# Patient Record
Sex: Male | Born: 1995 | Race: Black or African American | Hispanic: No | Marital: Single | Smoking: Never smoker
Health system: Southern US, Community
[De-identification: ages and names within clinical notes are randomized; demographics above are authoritative.]

## PROBLEM LIST (undated history)

## (undated) DIAGNOSIS — S83512D Sprain of anterior cruciate ligament of left knee, subsequent encounter: Secondary | ICD-10-CM

## (undated) DIAGNOSIS — S83207D Unspecified tear of unspecified meniscus, current injury, left knee, subsequent encounter: Secondary | ICD-10-CM

## (undated) HISTORY — PX: WISDOM TOOTH EXTRACTION: SHX21

---
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
## 2016-03-22 ENCOUNTER — Encounter (HOSPITAL_COMMUNITY): Payer: Self-pay | Admitting: *Deleted

## 2016-03-22 DIAGNOSIS — R51 Headache: Secondary | ICD-10-CM | POA: Insufficient documentation

## 2016-03-22 DIAGNOSIS — R930 Abnormal findings on diagnostic imaging of skull and head, not elsewhere classified: Secondary | ICD-10-CM | POA: Insufficient documentation

## 2016-03-22 NOTE — ED Notes (Signed)
Pt states he was bench presseing at the gym and had onset of headache in the back of his head. Initially he felt lightheaded, now subsided. Took Aleve prior to arrival without relief.

## 2016-03-23 ENCOUNTER — Emergency Department (HOSPITAL_COMMUNITY)
Admission: EM | Admit: 2016-03-23 | Discharge: 2016-03-23 | Disposition: A | Discharge status: Home / Self Care | Payer: Medicaid Other | Attending: Emergency Medicine | Admitting: Emergency Medicine

## 2016-03-23 ENCOUNTER — Emergency Department (HOSPITAL_COMMUNITY): Payer: Medicaid Other

## 2016-03-23 DIAGNOSIS — R51 Headache: Secondary | ICD-10-CM

## 2016-03-23 DIAGNOSIS — G08 Intracranial and intraspinal phlebitis and thrombophlebitis: Secondary | ICD-10-CM

## 2016-03-23 DIAGNOSIS — R519 Headache, unspecified: Secondary | ICD-10-CM

## 2016-03-23 LAB — I-STAT CHEM 8, ED
BUN: 14 mg/dL (ref 6–20)
CALCIUM ION: 1.15 mmol/L (ref 1.13–1.30)
CREATININE: 1.2 mg/dL (ref 0.61–1.24)
Chloride: 108 mmol/L (ref 101–111)
GLUCOSE: 83 mg/dL (ref 65–99)
HEMATOCRIT: 43 % (ref 39.0–52.0)
HEMOGLOBIN: 14.6 g/dL (ref 13.0–17.0)
Potassium: 3.8 mmol/L (ref 3.5–5.1)
Sodium: 142 mmol/L (ref 135–145)
TCO2: 25 mmol/L (ref 0–100)

## 2016-03-23 MED ORDER — IOPAMIDOL (ISOVUE-370) INJECTION 76%
INTRAVENOUS | Status: AC
Start: 2016-03-23 — End: 2016-03-23
  Administered 2016-03-23: 50 mL
  Filled 2016-03-23: qty 100

## 2016-03-23 MED ORDER — SODIUM CHLORIDE 0.9 % IV BOLUS (SEPSIS)
1000.0000 mL | Freq: Once | INTRAVENOUS | Status: AC
  Administered 2016-03-23: 1000 mL via INTRAVENOUS

## 2016-03-23 NOTE — ED Notes (Signed)
Patient transported to MRI 

## 2016-03-23 NOTE — Discharge Instructions (Signed)

## 2016-03-23 NOTE — ED Notes (Signed)
Returned from MRI 

## 2016-03-23 NOTE — ED Provider Notes (Signed)
MRI results were reviewed with the patient and his mother. No evidence of any abnormality. 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

## 2016-03-23 NOTE — ED Provider Notes (Signed)
CSN: [id number]     Arrival date & time 03/22/16  2222 History   First MD Initiated Contact with Patient 03/23/16 0142     Chief Complaint  Patient presents with  . Headache     (Consider location/radiation/quality/duration/timing/severity/associated sxs/prior Treatment) HPI Comments: 20 year old male with no significant past medical history presents to the emergency department for evaluation of headache. Patient states that he was bench pressing 225 pounds on an incline, which she has done before without difficulty, when he felt sudden onset of the pain in the back of his neck and occiput. He states that this sensation was followed by a few minutes of lightheadedness without syncope which progressed to a global headache. Patient reports taking NSAIDs at 1800 which temporarily improved his headache symptoms. He had some worsening of his headache at approximately 2100 and took additional medication for this. Patient has little complaints at this time. He denies any associated vision changes or vision loss, anterior neck pain, tinnitus or hearing loss, nausea, vomiting, photophobia, extremity numbness or weakness, chest pain, or shortness of breath. He has had no recent fevers.  Patient is a 20 y.o. male presenting with headaches. The history is provided by the patient. No language interpreter was used.  Headache   History reviewed. No pertinent past medical history. History reviewed. No pertinent past surgical history. No family history on file. Social History  Substance Use Topics  . Smoking status: Never Smoker   . Smokeless tobacco: None  . Alcohol Use: No    Review of Systems  Neurological: Positive for headaches.  Ten systems reviewed and are negative for acute change, except as noted in the HPI.    Allergies  Review of patient's allergies indicates not on file.  Home Medications   Prior to Admission medications   Not on File   BP 142/81 mmHg  Pulse 54  Temp(Src) 98.4 F  (36.9 C) (Oral)  Resp 18  Ht 5\' 10"  (1.778 m)  Wt 101.606 kg  BMI 32.14 kg/m2  SpO2 99%   Physical Exam  Constitutional: He is oriented to person, place, and time. He appears well-developed and well-nourished. No distress.  Nontoxic appearing and in no distress.  HENT:  Head: Normocephalic and atraumatic.  Mouth/Throat: Oropharynx is clear and moist. No oropharyngeal exudate.  Symmetric rise of the uvula with phonation.  Eyes: Conjunctivae and EOM are normal. Pupils are equal, round, and reactive to light. No scleral icterus.  Neck: Normal range of motion.  No nuchal rigidity or meningismus. No tenderness to the cervical paraspinal muscles bilaterally.  Cardiovascular: Normal rate, regular rhythm and intact distal pulses.   Pulmonary/Chest: Effort normal and breath sounds normal. No respiratory distress. He has no wheezes. He has no rales.  Lungs clear to auscultation bilaterally.  Musculoskeletal: Normal range of motion.  Neurological: He is alert and oriented to person, place, and time. No cranial nerve deficit. He exhibits normal muscle tone. Coordination normal.  GCS 15. Speech is goal oriented. No cranial nerve deficits appreciated; symmetric eyebrow raise, no facial drooping, tongue midline. Patient has equal grip strength bilaterally with 5/5 strength against resistance in all major muscle groups bilaterally. Sensation to light touch intact. Patient moves extremities without ataxia. He ambulates with steady gait.  Skin: Skin is warm and dry. No rash noted. He is not diaphoretic. No erythema. No pallor.  Psychiatric: He has a normal mood and affect. His behavior is normal.  Nursing note and vitals reviewed.   ED Course  Procedures (including  critical care time) Labs Review Labs Reviewed  I-STAT CHEM 8, ED    Imaging Review No results found.   I have personally reviewed and evaluated these images and lab results as part of my medical decision-making.   EKG  Interpretation   Date/Time:  Wednesday March 23 2016 02:18:08 EDT Ventricular Rate:  67 PR Interval:    QRS Duration: 105 QT Interval:  391 QTC Calculation: 413 R Axis:   80 Text Interpretation:  Sinus rhythm Borderline Q waves in lateral leads No  significant change since last tracing Confirmed by Erroll Lunani, Adeleke Ayokunle  [phone]([id number]) on 03/23/2016 2:30:09 AM      6:09 AM Notified by Particia Latherourtney Bloomer, MD, of radiology about concern for superior sagittal sinus thrombosis on CTA of the head and neck. There has been difficulty obtaining all images causing delay in official read; however, Dr. Karie KirksBloomer verbally recommends further evaluation with MRV. She states that CTA findings may represent a normal variant, but unable to rule out superior sagittal sinus thrombosis without further imaging.  MDM   Final diagnoses:  Acute nonintractable headache, unspecified headache type    20 year old male presents to the emergency department for acute onset of neck pain while lifting weights. This was followed by lightheadedness as well as a global headache which improved temporarily with NSAIDs. Patient with a nonfocal neurologic exam today. He was markedly tachycardic when initially triaged. Heart rate has normalized and EKG shows no PR interval changes to suggest an underlying tachyarrhythmia. Patient denies chest pain and shortness of breath.  Case discussed with my attending, Dr. Mora Bellmanni, who recommended CTA to evaluate for dissection. 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 Further evaluation with MRV recommended. Dr. Mora Bellmanni to follow and discuss need for further work up with the patient and mother, at bedside. If MRV negative, anticipate discharge home.    Antony MaduraKelly Auria Mckinlay, PA-C 03/23/16 [phone]  Tomasita CrumbleAdeleke Oni, MD 03/23/16 [phone]

## 2016-03-23 NOTE — ED Notes (Signed)
Pt back from CT

## 2016-03-23 NOTE — ED Notes (Signed)
Lifting weights and had sudden onset headache and neck pain.

## 2016-03-23 NOTE — ED Notes (Signed)
Family updated on plan of care CT will take pt next.

## 2016-03-23 NOTE — ED Notes (Signed)
Patient transported to CT 

## 2016-06-30 IMAGING — CT CT ANGIO NECK
1 of 17 series · 2 of 33 positions shown · IV contrast (Iohexol (Omnipaque 350))
Comparison: None.

CLINICAL DATA: Acute onset head and neck pain while lifting
weights.

EXAM:
CT ANGIOGRAPHY HEAD AND NECK
TECHNIQUE: Multidetector CT imaging of the head and neck was performed using
the standard protocol during bolus administration of intravenous
contrast. Multiplanar CT image reconstructions and MIPs were
obtained to evaluate the vascular anatomy. Carotid stenosis
measurements (when applicable) are obtained utilizing NASCET
criteria, using the distal internal carotid diameter as the
denominator.
CONTRAST:  50 cc IV Isovue 370

[Series 5011: new ax mip · axial · 0.55mm/px · z∈[+718,+830]mm · 2 of 338 slices shown]
[im 113/338  soft-tissue]
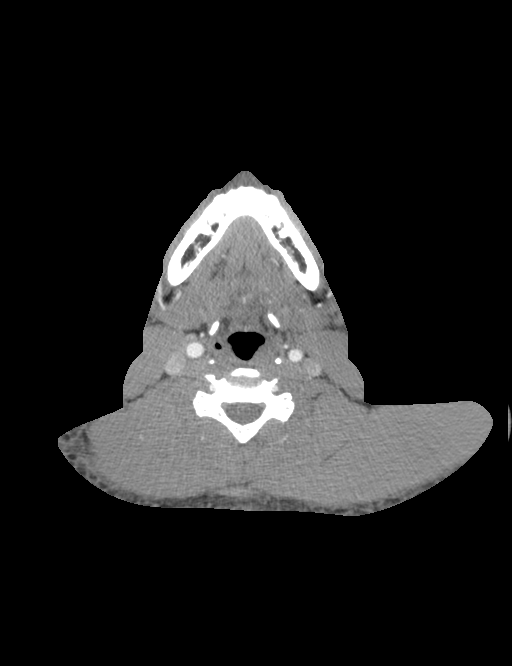
[im 225/338  bone]
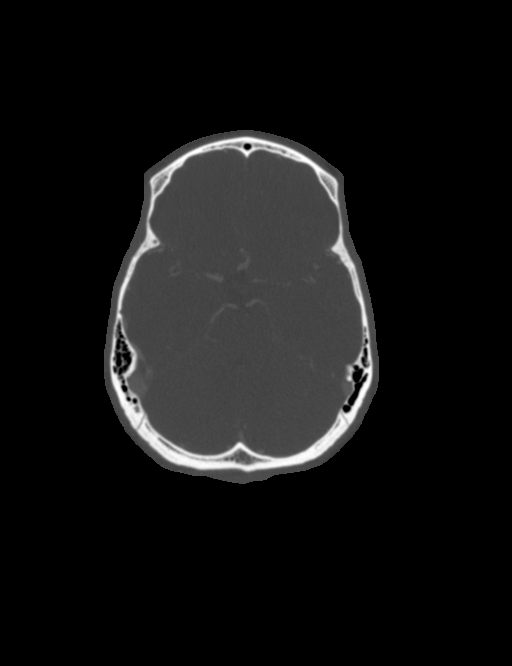

[2 of 33 positions shown; findings below may reference images not displayed]

FINDINGS: CT HEAD

INTRACRANIAL CONTENTS: The ventricles and sulci are normal. No
intraparenchymal hemorrhage, mass effect nor midline shift. No acute
large vascular territory infarcts. No abnormal extra-axial fluid
collections. Basal cisterns are patent.

ORBITS: The included ocular globes and orbital contents are normal.

SINUSES: The mastoid aircells and included paranasal sinuses are
well-aerated.

SKULL/SOFT TISSUES: No skull fracture. No significant soft tissue
swelling.

CTA NECK

AORTIC ARCH: Normal appearance of the thoracic arch, normal branch
pattern. The origins of the innominate, left Common carotid artery
and subclavian artery are widely patent.

RIGHT CAROTID SYSTEM: Common carotid artery is widely patent,
coursing in a straight line fashion. Normal appearance of the
carotid bifurcation without hemodynamically significant stenosis by
NASCET criteria. Normal appearance of the included internal carotid
artery.

LEFT CAROTID SYSTEM: Common carotid artery is widely patent,
coursing in a straight line fashion. Normal appearance of the
carotid bifurcation without hemodynamically significant stenosis by
NASCET criteria. Normal appearance of the included internal carotid
artery.

VERTEBRAL ARTERIES:Left vertebral artery is dominant. Normal
appearance of the vertebral arteries, which appear widely patent.

SKELETON: No acute osseous process though bone windows have not been
submitted.

OTHER NECK: Soft tissues of the neck are non-acute though, not
tailored for evaluation. 6 mm RIGHT thyroid nodule is below size
followup recommendation.

CTA HEAD

ANTERIOR CIRCULATION: Normal appearance of the cervical internal
carotid arteries, petrous, cavernous and supra clinoid internal
carotid arteries. Widely patent anterior communicating artery.
Normal appearance of the anterior and middle cerebral arteries.

No large vessel occlusion, hemodynamically significant stenosis,
dissection, luminal irregularity, contrast extravasation or
aneurysm.

POSTERIOR CIRCULATION: Codominant vertebral arteries with normal
appearance of the vertebral arteries, vertebrobasilar junction and
basilar artery, as well as main branch vessels. Robust RIGHT
posterior communicating artery present. Normal appearance of the
posterior cerebral arteries.

No large vessel occlusion, hemodynamically significant stenosis,
dissection, luminal irregularity, contrast extravasation or
aneurysm.

VENOUS SINUSES: No significant contrast opacification the anterior
superior sagittal sinus.

ANATOMIC VARIANTS: None.

DELAYED PHASE: No abnormal intracranial enhancement.
IMPRESSION: Normal CT HEAD.

Poor contrast opacification anterior superior sagittal sinus
concerning for thrombosis, versus developmental variant. Recommend
MRV head.

Otherwise negative CTA HEAD and neck.

Acute findings discussed with and reconfirmed by ARATI PAVEL on
03/23/2016 at [DATE].

## 2018-09-17 ENCOUNTER — Encounter (HOSPITAL_BASED_OUTPATIENT_CLINIC_OR_DEPARTMENT_OTHER): Payer: Self-pay | Admitting: Physician Assistant

## 2018-09-17 DIAGNOSIS — S83512D Sprain of anterior cruciate ligament of left knee, subsequent encounter: Secondary | ICD-10-CM

## 2018-09-17 DIAGNOSIS — S83207D Unspecified tear of unspecified meniscus, current injury, left knee, subsequent encounter: Secondary | ICD-10-CM

## 2018-09-17 HISTORY — DX: Unspecified tear of unspecified meniscus, current injury, left knee, subsequent encounter: S83.512D

## 2018-09-17 NOTE — H&P (Addendum)
Jeffrey Jeffrey Johnston is an 22 y.o. male.   Chief Complaint: left knee ACL tear with meniscus tears HPI: [name] is a 22 year-old [employer] seen for follow up from his left knee ACL tear with medial and lateral meniscal tears and LCL sprain.  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  He is almost one month status post injury.    Past Medical History:  Diagnosis Date  . Known health problems: none   . Tears of meniscus and ACL of left knee, subsequent encounter 09/17/2018    Past Surgical History:  Procedure Laterality Date  . none      History reviewed. No pertinent family history. Social History:  reports that he has never smoked. He does not have any smokeless tobacco history on file. He reports that he does not drink alcohol or use drugs.  Allergies: No Known Allergies  No medications prior to admission.    No results found for this or any previous visit (from the past 48 hour(s)). No results found.  Review of Systems  Constitutional: Negative.   HENT: Negative.   Eyes: Negative.   Respiratory: Negative.   Cardiovascular: Negative.   Gastrointestinal: Negative.   Genitourinary: Negative.   Musculoskeletal: Positive for joint pain.  Skin: Negative.   Neurological: Negative.   Endo/Heme/Allergies: Negative.   Psychiatric/Behavioral: Negative.     Height 5\' 11"  (1.803 m), weight 95.3 kg. Physical Exam  Constitutional: He is oriented to person, place, and time. He appears well-developed and well-nourished.  HENT:  Head: Normocephalic and atraumatic.  Mouth/Throat: Oropharynx is clear and moist.  Eyes: Pupils are equal, round, and reactive to light. Conjunctivae and EOM are normal.  Neck: Neck supple.  Cardiovascular: Normal rate.  Respiratory: Effort normal.  GI: Soft.  Genitourinary:    Genitourinary Comments: Not pertinent to current symptomatology therefore not examined.   Musculoskeletal:   Comments: Alert and oriented.  Examination of his left knee reveals 1-2+ effusion.  3+ Lachman.  Range of motion 0-115 degrees.  Knee is otherwise stable.  Right knee has full range of motion without pain, swelling or instability  Neurological: He is alert and oriented to person, place, and time.  Skin: Skin is warm and dry.  Psychiatric: He has a normal mood and affect. His behavior is normal.     Assessment Principal Problem:   Tears of meniscus and ACL of left knee, subsequent encounter   Plan Left knee hamstring autograft anterior cruciate ligament reconstruction with partial medial and lateral meniscectomies versus repairs.  The risks, benefits, and possible complications of the procedure were discussed in detail with the patient.  The patient is without question.  Haleemah Buckalew Tama HeadingsJ Kieara Schwark, PA-C 09/17/2018, 8:37 PM

## 2018-09-20 ENCOUNTER — Other Ambulatory Visit: Payer: Self-pay

## 2018-09-20 ENCOUNTER — Encounter (HOSPITAL_BASED_OUTPATIENT_CLINIC_OR_DEPARTMENT_OTHER): Payer: Self-pay | Admitting: *Deleted

## 2018-09-27 NOTE — Anesthesia Preprocedure Evaluation (Addendum)
Anesthesia Evaluation  Patient identified by MRN, date of birth, ID band Patient awake    Reviewed: Allergy & Precautions, NPO status , Patient's Chart, lab work & pertinent test results  History of Anesthesia Complications Negative for: history of anesthetic complications  Airway Mallampati: I  TM Distance: >3 FB Neck ROM: Full    Dental  (+) Dental Advisory Given, Teeth Intact   Pulmonary neg pulmonary ROS,    breath sounds clear to auscultation       Cardiovascular negative cardio ROS   Rhythm:Regular Rate:Normal     Neuro/Psych negative neurological ROS     GI/Hepatic negative GI ROS, Neg liver ROS,   Endo/Other  negative endocrine ROS  Renal/GU negative Renal ROS     Musculoskeletal   Abdominal   Peds  Hematology negative hematology ROS (+)   Anesthesia Other Findings   Reproductive/Obstetrics                             Anesthesia Physical Anesthesia Plan  ASA: I  Anesthesia Plan: General   Post-op Pain Management: GA combined w/ Regional for post-op pain   Induction: Intravenous  PONV Risk Score and Plan: 3 and Ondansetron, Dexamethasone and Scopolamine patch - Pre-op  Airway Management Planned: LMA  Additional Equipment:   Intra-op Plan:   Post-operative Plan:   Informed Consent: I have reviewed the patients History and Physical, chart, labs and discussed the procedure including the risks, benefits and alternatives for the proposed anesthesia with the patient or authorized representative who has indicated his/her understanding and acceptance.   Dental advisory given  Plan Discussed with: CRNA and Surgeon  Anesthesia Plan Comments: (Plan routine monitors, GA- LMA OK, adductor canal block for post op analgesia)       Anesthesia Quick Evaluation

## 2018-09-28 ENCOUNTER — Ambulatory Visit (HOSPITAL_BASED_OUTPATIENT_CLINIC_OR_DEPARTMENT_OTHER): Payer: 59 | Admitting: Anesthesiology

## 2018-09-28 ENCOUNTER — Other Ambulatory Visit: Payer: Self-pay

## 2018-09-28 ENCOUNTER — Ambulatory Visit (HOSPITAL_BASED_OUTPATIENT_CLINIC_OR_DEPARTMENT_OTHER)
Admission: RE | Admit: 2018-09-28 | Discharge: 2018-09-28 | Disposition: A | Discharge status: Home / Self Care | Payer: 59 | Attending: Orthopedic Surgery | Admitting: Orthopedic Surgery

## 2018-09-28 ENCOUNTER — Encounter (HOSPITAL_BASED_OUTPATIENT_CLINIC_OR_DEPARTMENT_OTHER): Payer: Self-pay | Admitting: *Deleted

## 2018-09-28 ENCOUNTER — Encounter (HOSPITAL_BASED_OUTPATIENT_CLINIC_OR_DEPARTMENT_OTHER)
Admission: RE | Disposition: A | Discharge status: Home / Self Care | Payer: Self-pay | Source: Home / Self Care | Attending: Orthopedic Surgery

## 2018-09-28 DIAGNOSIS — S83242A Other tear of medial meniscus, current injury, left knee, initial encounter: Secondary | ICD-10-CM | POA: Diagnosis present

## 2018-09-28 DIAGNOSIS — Y9361 Activity, american tackle football: Secondary | ICD-10-CM | POA: Diagnosis not present

## 2018-09-28 DIAGNOSIS — S83222A Peripheral tear of medial meniscus, current injury, left knee, initial encounter: Secondary | ICD-10-CM | POA: Diagnosis not present

## 2018-09-28 DIAGNOSIS — S83512A Sprain of anterior cruciate ligament of left knee, initial encounter: Secondary | ICD-10-CM | POA: Insufficient documentation

## 2018-09-28 DIAGNOSIS — X501XXA Overexertion from prolonged static or awkward postures, initial encounter: Secondary | ICD-10-CM | POA: Diagnosis not present

## 2018-09-28 DIAGNOSIS — S83207D Unspecified tear of unspecified meniscus, current injury, left knee, subsequent encounter: Secondary | ICD-10-CM

## 2018-09-28 DIAGNOSIS — S83282A Other tear of lateral meniscus, current injury, left knee, initial encounter: Secondary | ICD-10-CM | POA: Insufficient documentation

## 2018-09-28 DIAGNOSIS — S83512D Sprain of anterior cruciate ligament of left knee, subsequent encounter: Secondary | ICD-10-CM

## 2018-09-28 HISTORY — DX: Sprain of anterior cruciate ligament of left knee, subsequent encounter: S83.512D

## 2018-09-28 HISTORY — PX: KNEE ARTHROSCOPY WITH ANTERIOR CRUCIATE LIGAMENT (ACL) REPAIR WITH HAMSTRING GRAFT: SHX5645

## 2018-09-28 HISTORY — DX: Unspecified tear of unspecified meniscus, current injury, left knee, subsequent encounter: S83.207D

## 2018-09-28 SURGERY — KNEE ARTHROSCOPY WITH ANTERIOR CRUCIATE LIGAMENT (ACL) REPAIR WITH HAMSTRING GRAFT
Anesthesia: General | Site: Knee | Laterality: Left

## 2018-09-28 MED ORDER — CHLORHEXIDINE GLUCONATE 4 % EX LIQD: 60.0000 mL | Freq: Once | CUTANEOUS | Status: DC

## 2018-09-28 MED ORDER — MIDAZOLAM HCL 2 MG/2ML IJ SOLN
INTRAMUSCULAR | Status: AC
  Filled 2018-09-28: qty 2

## 2018-09-28 MED ORDER — LACTATED RINGERS IV SOLN
INTRAVENOUS | Status: DC
  Administered 2018-09-28 (×2): via INTRAVENOUS

## 2018-09-28 MED ORDER — SCOPOLAMINE 1 MG/3DAYS TD PT72
MEDICATED_PATCH | TRANSDERMAL | Status: AC
  Filled 2018-09-28: qty 1

## 2018-09-28 MED ORDER — HYDROMORPHONE HCL 1 MG/ML IJ SOLN
INTRAMUSCULAR | Status: AC
  Filled 2018-09-28: qty 0.5

## 2018-09-28 MED ORDER — SCOPOLAMINE 1 MG/3DAYS TD PT72: 1.0000 | MEDICATED_PATCH | Freq: Once | TRANSDERMAL | Status: DC | PRN

## 2018-09-28 MED ORDER — PROMETHAZINE HCL 25 MG/ML IJ SOLN: 6.2500 mg | INTRAMUSCULAR | Status: DC | PRN

## 2018-09-28 MED ORDER — LIDOCAINE HCL (CARDIAC) PF 100 MG/5ML IV SOSY
PREFILLED_SYRINGE | INTRAVENOUS | Status: DC | PRN
  Administered 2018-09-28: 40 mg via INTRAVENOUS

## 2018-09-28 MED ORDER — ONDANSETRON HCL 4 MG/2ML IJ SOLN
INTRAMUSCULAR | Status: DC | PRN
  Administered 2018-09-28: 4 mg via INTRAVENOUS

## 2018-09-28 MED ORDER — PROPOFOL 10 MG/ML IV BOLUS
INTRAVENOUS | Status: AC
  Filled 2018-09-28: qty 20

## 2018-09-28 MED ORDER — PROPOFOL 10 MG/ML IV BOLUS
INTRAVENOUS | Status: DC | PRN
  Administered 2018-09-28: 300 mg via INTRAVENOUS

## 2018-09-28 MED ORDER — PROPOFOL 500 MG/50ML IV EMUL
INTRAVENOUS | Status: AC
  Filled 2018-09-28: qty 50

## 2018-09-28 MED ORDER — MEPERIDINE HCL 25 MG/ML IJ SOLN: 6.2500 mg | INTRAMUSCULAR | Status: DC | PRN

## 2018-09-28 MED ORDER — PROMETHAZINE HCL 12.5 MG PO TABS: 12.5000 mg | ORAL_TABLET | Freq: Four times a day (QID) | ORAL | 0 refills | Status: AC | PRN

## 2018-09-28 MED ORDER — LACTATED RINGERS IV SOLN: INTRAVENOUS | Status: DC

## 2018-09-28 MED ORDER — FENTANYL CITRATE (PF) 100 MCG/2ML IJ SOLN
INTRAMUSCULAR | Status: AC
  Filled 2018-09-28: qty 2

## 2018-09-28 MED ORDER — ROPIVACAINE HCL 7.5 MG/ML IJ SOLN
INTRAMUSCULAR | Status: DC | PRN
  Administered 2018-09-28: 20 mL via PERINEURAL

## 2018-09-28 MED ORDER — HYDROMORPHONE HCL 1 MG/ML IJ SOLN
0.2500 mg | INTRAMUSCULAR | Status: DC | PRN
  Administered 2018-09-28 (×3): 0.5 mg via INTRAVENOUS

## 2018-09-28 MED ORDER — MIDAZOLAM HCL 2 MG/2ML IJ SOLN: 0.5000 mg | Freq: Once | INTRAMUSCULAR | Status: DC | PRN

## 2018-09-28 MED ORDER — DEXAMETHASONE SODIUM PHOSPHATE 4 MG/ML IJ SOLN
INTRAMUSCULAR | Status: DC | PRN
  Administered 2018-09-28: 10 mg via INTRAVENOUS

## 2018-09-28 MED ORDER — OXYCODONE HCL 5 MG PO TABS
5.0000 mg | ORAL_TABLET | Freq: Once | ORAL | Status: AC
  Administered 2018-09-28: 5 mg via ORAL

## 2018-09-28 MED ORDER — OXYCODONE HCL 5 MG PO TABS: 5.0000 mg | ORAL_TABLET | ORAL | 0 refills | Status: AC | PRN

## 2018-09-28 MED ORDER — BUPIVACAINE-EPINEPHRINE 0.25% -1:200000 IJ SOLN
INTRAMUSCULAR | Status: DC | PRN
  Administered 2018-09-28: 20 mL

## 2018-09-28 MED ORDER — DEXAMETHASONE SODIUM PHOSPHATE 10 MG/ML IJ SOLN
INTRAMUSCULAR | Status: AC
  Filled 2018-09-28: qty 1

## 2018-09-28 MED ORDER — POVIDONE-IODINE 7.5 % EX SOLN: Freq: Once | CUTANEOUS | Status: DC

## 2018-09-28 MED ORDER — BUPIVACAINE-EPINEPHRINE (PF) 0.25% -1:200000 IJ SOLN
INTRAMUSCULAR | Status: AC
  Filled 2018-09-28: qty 30

## 2018-09-28 MED ORDER — LIDOCAINE 2% (20 MG/ML) 5 ML SYRINGE
INTRAMUSCULAR | Status: AC
  Filled 2018-09-28: qty 5

## 2018-09-28 MED ORDER — FENTANYL CITRATE (PF) 100 MCG/2ML IJ SOLN
50.0000 ug | INTRAMUSCULAR | Status: AC | PRN
  Administered 2018-09-28: 25 ug via INTRAVENOUS
  Administered 2018-09-28 (×5): 50 ug via INTRAVENOUS
  Administered 2018-09-28: 25 ug via INTRAVENOUS
  Administered 2018-09-28: 50 ug via INTRAVENOUS

## 2018-09-28 MED ORDER — SCOPOLAMINE 1 MG/3DAYS TD PT72
1.0000 | MEDICATED_PATCH | Freq: Once | TRANSDERMAL | Status: DC
  Administered 2018-09-28: 1.5 mg via TRANSDERMAL

## 2018-09-28 MED ORDER — CEFAZOLIN SODIUM-DEXTROSE 2-4 GM/100ML-% IV SOLN
2.0000 g | INTRAVENOUS | Status: AC
  Administered 2018-09-28: 2 g via INTRAVENOUS

## 2018-09-28 MED ORDER — CEFAZOLIN SODIUM-DEXTROSE 2-4 GM/100ML-% IV SOLN
INTRAVENOUS | Status: AC
  Filled 2018-09-28: qty 100

## 2018-09-28 MED ORDER — OXYCODONE HCL 5 MG PO TABS
ORAL_TABLET | ORAL | Status: AC
  Filled 2018-09-28: qty 1

## 2018-09-28 MED ORDER — ONDANSETRON HCL 4 MG/2ML IJ SOLN
INTRAMUSCULAR | Status: AC
  Filled 2018-09-28: qty 2

## 2018-09-28 MED ORDER — MIDAZOLAM HCL 2 MG/2ML IJ SOLN
1.0000 mg | INTRAMUSCULAR | Status: DC | PRN
  Administered 2018-09-28: 2 mg via INTRAVENOUS

## 2018-09-28 SURGICAL SUPPLY — 91 items
ALLOGRAFT GRFTLNK IMPLANT SYST (Anchor) IMPLANT
ANCH SUT PUSHLCK 19.5X3.5 STRL (Anchor) ×1 IMPLANT
ANCHOR BUTTON TIGHTROPE RN 14 (Anchor) ×2 IMPLANT
ANCHOR PUSHLOCK PEEK 3.5X19.5 (Anchor) ×2 IMPLANT
APL SKNCLS STERI-STRIP NONHPOA (GAUZE/BANDAGES/DRESSINGS) ×1
BANDAGE ACE 4X5 VEL STRL LF (GAUZE/BANDAGES/DRESSINGS) ×3 IMPLANT
BENZOIN TINCTURE PRP APPL 2/3 (GAUZE/BANDAGES/DRESSINGS) ×3 IMPLANT
BLADE CUDA GRT WHITE 3.5 (BLADE) ×3 IMPLANT
BLADE CUTTER GATOR 3.5 (BLADE) ×3 IMPLANT
BLADE HEX COATED 2.75 (ELECTRODE) ×2 IMPLANT
BLADE SURG 15 STRL LF DISP TIS (BLADE) ×1 IMPLANT
BLADE SURG 15 STRL SS (BLADE) ×3
BONE TUNNEL PLUG CANNULATED (MISCELLANEOUS) ×2 IMPLANT
BUR OVAL 6.0 (BURR) ×3 IMPLANT
CLOSURE WOUND 1/2 X4 (GAUZE/BANDAGES/DRESSINGS) ×1
COVER BACK TABLE 60X90IN (DRAPES) ×3 IMPLANT
COVER WAND RF STERILE (DRAPES) IMPLANT
DECANTER SPIKE VIAL GLASS SM (MISCELLANEOUS) IMPLANT
DRAPE ARTHROSCOPY W/POUCH 90 (DRAPES) ×3 IMPLANT
DRAPE IMP U-DRAPE 54X76 (DRAPES) ×3 IMPLANT
DRAPE OEC MINIVIEW 54X84 (DRAPES) ×3 IMPLANT
DRAPE U-SHAPE 47X51 STRL (DRAPES) ×3 IMPLANT
DRAPE U-SHAPE 76X120 STRL (DRAPES) ×3 IMPLANT
DRILL FLIPCUTTER II 10.5MM (CUTTER) IMPLANT
DRILL FLIPCUTTER II 10MM (CUTTER) IMPLANT
DRILL FLIPCUTTER II 7.5MM (MISCELLANEOUS) IMPLANT
DRILL FLIPCUTTER III 6-12 (ORTHOPEDIC DISPOSABLE SUPPLIES) IMPLANT
DRSG PAD ABDOMINAL 8X10 ST (GAUZE/BANDAGES/DRESSINGS) IMPLANT
DURAPREP 26ML APPLICATOR (WOUND CARE) ×3 IMPLANT
ELECT REM PT RETURN 9FT ADLT (ELECTROSURGICAL) ×3
ELECTRODE REM PT RTRN 9FT ADLT (ELECTROSURGICAL) ×1 IMPLANT
FLIPCUTTER II 10.5MM (CUTTER)
FLIPCUTTER II 10MM (CUTTER)
FLIPCUTTER II 7.5MM (MISCELLANEOUS)
FLIPCUTTER III 6-12 AR-1204FF (ORTHOPEDIC DISPOSABLE SUPPLIES) ×3
GAUZE SPONGE 4X4 12PLY STRL (GAUZE/BANDAGES/DRESSINGS) ×3 IMPLANT
GAUZE XEROFORM 1X8 LF (GAUZE/BANDAGES/DRESSINGS) ×3 IMPLANT
GLOVE BIO SURGEON STRL SZ7 (GLOVE) ×6 IMPLANT
GLOVE BIOGEL PI IND STRL 7.0 (GLOVE) ×2 IMPLANT
GLOVE BIOGEL PI IND STRL 7.5 (GLOVE) ×1 IMPLANT
GLOVE BIOGEL PI IND STRL 8 (GLOVE) IMPLANT
GLOVE BIOGEL PI INDICATOR 7.0 (GLOVE) ×8
GLOVE BIOGEL PI INDICATOR 7.5 (GLOVE) ×6
GLOVE BIOGEL PI INDICATOR 8 (GLOVE) ×4
GLOVE SS BIOGEL STRL SZ 7.5 (GLOVE) ×1 IMPLANT
GLOVE SUPERSENSE BIOGEL SZ 7.5 (GLOVE) ×2
GOWN STRL REUS W/ TWL LRG LVL3 (GOWN DISPOSABLE) ×2 IMPLANT
GOWN STRL REUS W/ TWL XL LVL3 (GOWN DISPOSABLE) ×2 IMPLANT
GOWN STRL REUS W/TWL LRG LVL3 (GOWN DISPOSABLE) ×6
GOWN STRL REUS W/TWL XL LVL3 (GOWN DISPOSABLE) ×6
GRAFT TISS 60-80 FRZN TENDON (Tissue) IMPLANT
IMMOBILIZER KNEE 22 UNIV (SOFTGOODS) IMPLANT
IMMOBILIZER KNEE 24 THIGH 36 (MISCELLANEOUS) IMPLANT
IMMOBILIZER KNEE 24 UNIV (MISCELLANEOUS) ×3
IMPL SEQUENT MENISCAL 4 (Miscellaneous) IMPLANT
IMPLANT SEQUENT MENISCAL 4 (Miscellaneous) ×3 IMPLANT
KNEE WRAP E Z 3 GEL PACK (MISCELLANEOUS) ×3 IMPLANT
MANIFOLD NEPTUNE II (INSTRUMENTS) ×3 IMPLANT
MARKER SKIN DUAL TIP RULER LAB (MISCELLANEOUS) ×3 IMPLANT
NDL SAFETY ECLIPSE 18X1.5 (NEEDLE) ×2 IMPLANT
NEEDLE HYPO 18GX1.5 SHARP (NEEDLE) ×6
NEEDLE HYPO 22GX1.5 SAFETY (NEEDLE) IMPLANT
PACK ARTHROSCOPY DSU (CUSTOM PROCEDURE TRAY) ×3 IMPLANT
PACK BASIN DAY SURGERY FS (CUSTOM PROCEDURE TRAY) ×3 IMPLANT
PAD CAST 4YDX4 CTTN HI CHSV (CAST SUPPLIES) IMPLANT
PADDING CAST COTTON 4X4 STRL (CAST SUPPLIES)
PENCIL BUTTON HOLSTER BLD 10FT (ELECTRODE) ×2 IMPLANT
PIN DRILL ACL TIGHTROPE 4MM (PIN) IMPLANT
PK GRAFTLINK ALLO IMPLANT SYST (Anchor) ×3 IMPLANT
SLEEVE SCD COMPRESS KNEE MED (MISCELLANEOUS) ×3 IMPLANT
SPONGE LAP 4X18 RFD (DISPOSABLE) ×3 IMPLANT
STOCKING TED THIGH LEN LRG REG (STOCKING)
STOCKING TED THIGH LEN MED REG (STOCKING)
STOCKING THIGH LG REG (STOCKING) IMPLANT
STOCKING THIGH MED REG (STOCKING) IMPLANT
STRIP CLOSURE SKIN 1/2X4 (GAUZE/BANDAGES/DRESSINGS) ×2 IMPLANT
SUCTION FRAZIER HANDLE 10FR (MISCELLANEOUS) ×2
SUCTION TUBE FRAZIER 10FR DISP (MISCELLANEOUS) ×1 IMPLANT
SUT 0 FIBERLOOP 38 BLUE TPR ND (SUTURE) ×6
SUT PROLENE 3 0 PS 2 (SUTURE) ×3 IMPLANT
SUT VIC AB 3-0 SH 27 (SUTURE) ×3
SUT VIC AB 3-0 SH 27X BRD (SUTURE) ×1 IMPLANT
SUTURE 0 FIBERLP 38 BLU TPR ND (SUTURE) ×2 IMPLANT
SYR 20CC LL (SYRINGE) ×2 IMPLANT
SYR 5ML LL (SYRINGE) ×3 IMPLANT
TISSUE GRAFTLINK FGL (Tissue) ×3 IMPLANT
TOWEL GREEN STERILE FF (TOWEL DISPOSABLE) ×6 IMPLANT
TOWEL OR NON WOVEN STRL DISP B (DISPOSABLE) ×3 IMPLANT
TUBING ARTHRO INFLOW-ONLY STRL (TUBING) ×3 IMPLANT
WAND STAR VAC 90 (SURGICAL WAND) ×2 IMPLANT
WATER STERILE IRR 1000ML POUR (IV SOLUTION) ×3 IMPLANT

## 2018-09-28 NOTE — Anesthesia Procedure Notes (Signed)
Procedure Name: LMA Insertion Date/Time: 09/28/2018 7:38 AM Performed by: Burna Cash, CRNA Pre-anesthesia Checklist: Patient identified, Emergency Drugs available, Suction available and Patient being monitored Patient Re-evaluated:Patient Re-evaluated prior to induction Oxygen Delivery Method: Circle system utilized Preoxygenation: Pre-oxygenation with 100% oxygen Induction Type: IV induction Ventilation: Mask ventilation without difficulty LMA: LMA inserted LMA Size: 5.0 Number of attempts: 1 Airway Equipment and Method: Bite block Placement Confirmation: positive ETCO2 Tube secured with: Tape Dental Injury: Teeth and Oropharynx as per pre-operative assessment

## 2018-09-28 NOTE — Anesthesia Procedure Notes (Signed)
Anesthesia Regional Block: Adductor canal block   Pre-Anesthetic Checklist: ,, timeout performed, Correct Patient, Correct Site, Correct Laterality, Correct Procedure, Correct Position, site marked, Risks and benefits discussed,  Surgical consent,  Pre-op evaluation,  At surgeon's request and post-op pain management  Laterality: Left and Lower  Prep: chloraprep       Needles:  Injection technique: Single-shot  Needle Type: Echogenic Needle     Needle Length: 9cm  Needle Gauge: 21     Additional Needles:   Procedures:,,,, ultrasound used (permanent image in chart),,,,  Narrative:  Start time: 09/28/2018 7:10 AM End time: 09/28/2018 7:16 AM Injection made incrementally with aspirations every 5 mL.  Performed by: Personally  Anesthesiologist: Jairo Ben, MD  Additional Notes: Pt identified in Holding room.  Monitors applied. Working IV access confirmed. Sterile prep, drape L thigh.  #21ga ECHOgenic needle into adductor canal with US guidance.  20cc 0.75% Ropivacaine injected incrementally after negative test dose.  Patient asymptomatic, VSS, no heme aspirated, tolerated well.  Sandford Craze, MD

## 2018-09-28 NOTE — Anesthesia Postprocedure Evaluation (Signed)
Anesthesia Post Note  Patient: Jeffrey Johnston  Procedure(s) Performed: KNEE ARTHROSCOPY WITH LATERAL MENISECTOMY MEDIAL MENISCUS REPAIR, ANTERIOR CRUCIATE LIGAMENT (ACL) REPAIR WITH HAMSTRING GRAFT ALLOGRAPH (Left Knee)     Patient location during evaluation: PACU Anesthesia Type: General and Regional Level of consciousness: awake and alert Pain management: pain level controlled Vital Signs Assessment: post-procedure vital signs reviewed and stable Respiratory status: spontaneous breathing, nonlabored ventilation and respiratory function stable Cardiovascular status: blood pressure returned to baseline and stable Postop Assessment: no apparent nausea or vomiting and adequate PO intake Anesthetic complications: no    Last Vitals:  Vitals:   09/28/18 1116 09/28/18 1200  BP: (!) 143/92 (!) 155/90  Pulse:  67  Resp:    Temp:  36.8 C  SpO2:  100%    Last Pain:  Vitals:   09/28/18 1200  TempSrc:   PainSc: 4                  Threasa Kinch,E. Lamyra Malcolm

## 2018-09-28 NOTE — Addendum Note (Signed)
Addendum  created 09/28/18 1356 by Jairo Ben, MD   Intraprocedure Meds edited

## 2018-09-28 NOTE — Op Note (Signed)
NAMEARGUS, VANDECAR MEDICAL RECORD YS:[id number] ACCOUNT [id number] DATE OF BIRTH:August 24, 1996 FACILITY: [facility] LOCATION: [facility] PHYSICIAN:Damacio Weisgerber Salley Slaughter, MD  OPERATIVE REPORT  DATE OF PROCEDURE:  09/28/2018  PREOPERATIVE DIAGNOSES:   1.  Left knee acute traumatic anterior cruciate ligament tear. 2.  Left knee acute traumatic medial and lateral meniscal tears.  POSTOPERATIVE DIAGNOSES:   1.  Left knee acute traumatic anterior cruciate ligament tear. 2.  Left knee acute traumatic medial and lateral meniscal tears.  PROCEDURE PERFORMED: 1.  Left knee exam under anesthesia followed by arthroscopically assisted hamstring allograft anterior cruciate ligament reconstruction using Arthrex femoral TightRope with Arthrex tibial button plus PushLock anchor. 2.  Left knee medial meniscus repair using Linvatec meniscal repair sutures x3.   3.  Left knee partial lateral meniscectomy.  SURGEON:  Salvatore Marvel, MD  ASSISTANT:  Genelle Bal, PA.  ANESTHESIA:  General.  OPERATIVE TIME:  1 hour and 30 minutes.  SPECIMENS:  None.  INDICATIONS:  The patient is a 23 year old who sustained a twisting, pivoting injury to his left knee approximately five weeks ago playing sports.  Exam and MRIs revealed a complete ACL tear with medial and lateral meniscal tears.  He is now to undergo  ACL reconstruction with attention to his meniscal pathology.  DESCRIPTION OF PROCEDURE:  The patient was brought to the operating room on 09/28/2018 after an adductor canal block was placed in the holding room by anesthesia.  He was placed on the operating table in supine position.  He received antibiotics  preoperatively for prophylaxis.  After being placed under general anesthesia, his left knee was examined.  He had full range of motion, 3+ Lachman, positive pivot shift.  Knee stable to varus, valgus, anterior and posterior stress with normal patellar  tracking.  The knee was sterilely injected with 0.25%  Marcaine with epinephrine.  Left leg was then prepped using sterile DuraPrep and draped using sterile technique.  Time-out procedure was called.  The correct left knee identified.  Initially through  an anterolateral portal, the arthroscope with pump attached was placed into an anteromedial portal, an arthroscopic probe was placed.  On initial inspection of medial compartment, the articular cartilage was intact.  Medial meniscus showed a peripheral  tear of the posterior horn which was amenable to repair.  Using a Linvatec meniscal repair set, 3 separate mattress sutures were placed with 4 separate anchors with firm and tight fixation, thus completely fixing and repairing the medial meniscus back to  its anatomic position with firm and tight fixation.  Intercondylar notch was inspected.  The anterior cruciate ligament was completely torn in its midsubstance with significant anterior laxity and this was thoroughly debrided and a notchplasty was  performed.  The lateral compartment was inspected.  The articular cartilage was intact.  Lateral meniscus showed tearing of the posterior and lateral horn of which 30% was resected back to a stable rim.  Patellofemoral joint articular cartilage was  intact.  The patella tracked normally.  Medial and lateral gutters were free of pathology.  At this point, I attempted to perform a hamstring autograft reconstruction.  However, after 3.5 cm incision was made and approached to the semitendinosis and  gracilis tendons, these were found to be very scarred in and damaged from his injury and did not feel comfortable using these for a primary repair.  Because of this, I broke scrub and I spoke to the patient's mother and discussed the need for proceeding  with a hamstring allograft reconstruction instead of autograft  and she understood and agreed with this.  Risks, complication, benefits of this were discussed with her in detail and she understands this completely.  At this  point, I went back into the  operating room and we scrubbed and draped and proceeded.  Using a hamstring autograft measuring 9.5 mm in width and 65 mm in length, Kirsten Shepperson prepared this ACL graft on the back table while I prepared the inside of the knee to accept this  graft.  Using an Arthrex 9.5 mm tibial FlipCutter, the tibial tunnel was prepared in the anatomic position on the tibial plateau.  Through this tibial tunnel a posterior femoral guide was placed in the posterior femoral notch and a Steinmann pin drilled  up into the ACL origin point and then overdrilled with a 9 mm drill to a depth of 15 mm, leaving a posterior 2 mm bone bridge.  A double pin passer was then brought up through the tibial tunnel and joint and up through the femoral tunnel and through the  femoral cortex and thigh through a stab wound.  This was used to pass the Arthrex graft and TightRope up through the tibial tunnel and joint and into the femoral tunnel.  The TightRope was then deployed on the lateral femoral cortex and confirmed with  intraoperative fluoroscopy.  The femoral end of the graft was then deployed in the femoral tunnel with excellent fixation.  The knee was then brought through a full range of motion.  There was found to be no impingement of the graft.  The tibial end of  the graft was then locked in position with an Arthrex tibial button while Genelle BalKirsten Shepperson held the tibia reduced on the femur in 30 degrees of flexion.  The tibial end of the graft was then further secured with a PushLock anchor.  At this point, the  knee was tested for stability.  Lachman and pivot shift were found to be totally eliminated and the knee could be brought through a full range of motion with no impingement of the graft.  At this point, it was felt that all pathology had been  satisfactorily addressed.  The instruments were removed.  The anteromedial incision closed with 2-0 Vicryl and 4-0 Prolene.  Arthroscopic portals  closed with 4-0 Prolene.  Sterile dressings were applied and a long leg splint and then the patient awakened  and taken to recovery room in stable condition.  Needle and sponge count was correct x2 at the end of the case.  FOLLOWUP CARE:  The patient will be followed as an outpatient on oxycodone for pain.  He will be seen back in the office in a week for sutures out and followup.  TN/NUANCE  D:09/28/2018 T:09/28/2018 JOB:[id number]

## 2018-09-28 NOTE — Transfer of Care (Signed)
Immediate Anesthesia Transfer of Care Note  Patient: Jeffrey Johnston  Procedure(s) Performed: KNEE ARTHROSCOPY WITH LATERAL MENISECTOMY MEDIAL MENISCECTOMY REPAIR, ANTERIOR CRUCIATE LIGAMENT (ACL) REPAIR WITH HAMSTRING GRAFT ALLOGRAPH (Left Knee)  Patient Location: PACU  Anesthesia Type:GA combined with regional for post-op pain  Level of Consciousness: awake, alert  and oriented  Airway & Oxygen Therapy: Patient Spontanous Breathing and Patient connected to face mask oxygen  Post-op Assessment: Report given to RN and Post -op Vital signs reviewed and stable  Post vital signs: Reviewed and stable  Last Vitals:  Vitals Value Taken Time  BP 154/104 09/28/2018  9:53 AM  Temp    Pulse 107 09/28/2018  9:54 AM  Resp 20 09/28/2018  9:53 AM  SpO2 100 % 09/28/2018  9:54 AM  Vitals shown include unvalidated device data.  Last Pain:  Vitals:   09/28/18 0635  TempSrc: Oral  PainSc: 0-No pain         Complications: No apparent anesthesia complications

## 2018-09-28 NOTE — Interval H&P Note (Signed)
History and Physical Interval Note:  09/28/2018 7:17 AM  [name]  has presented today for surgery, with the diagnosis of TEAR OF MENISCUS ,TEAR OF LATERAL MENISCUS,SPRAIN  The various methods of treatment have been discussed with the patient and family. After consideration of risks, benefits and other options for treatment, the patient has consented to  Procedure(s): KNEE ARTHROSCOPY WITH LATERAL MENISECTOMY VS. REPAIR, ANTERIOR CRUCIATE LIGAMENT (ACL) REPAIR WITH HAMSTRING GRAFT (Left) as a surgical intervention .  The patient's history has been reviewed, patient examined, no change in status, stable for surgery.  I have reviewed the patient's chart and labs.  Questions were answered to the patient's satisfaction.     [name]

## 2018-09-28 NOTE — Discharge Instructions (Signed)
Regional Anesthesia Blocks ? ?1. Numbness or the inability to move the "blocked" extremity may last from 3-48 hours after placement. The length of time depends on the medication injected and your individual response to the medication. If the numbness is not going away after 48 hours, call your surgeon. ? ?2. The extremity that is blocked will need to be protected until the numbness is gone and the  Strength has returned. Because you cannot feel it, you will need to take extra care to avoid injury. Because it may be weak, you may have difficulty moving it or using it. You may not know what position it is in without looking at it while the block is in effect. ? ?3. For blocks in the legs and feet, returning to weight bearing and walking needs to be done carefully. You will need to wait until the numbness is entirely gone and the strength has returned. You should be able to move your leg and foot normally before you try and bear weight or walk. You will need someone to be with you when you first try to ensure you do not fall and possibly risk injury. ? ?4. Bruising and tenderness at the needle site are common side effects and will resolve in a few days. ? ?5. Persistent numbness or new problems with movement should be communicated to the surgeon or the [facility] ([phone])/ [facility] ([phone]).  ? ?Post Anesthesia Home Care Instructions ? ?Activity: ?Get plenty of rest for the remainder of the day. A responsible individual must stay with you for 24 hours following the procedure.  ?For the next 24 hours, DO NOT: ?-Drive a car ?-Operate machinery ?-Drink alcoholic beverages ?-Take any medication unless instructed by your physician ?-Make any legal decisions or sign important papers. ? ?Meals: ?Start with liquid foods such as gelatin or soup. Progress to regular foods as tolerated. Avoid greasy, spicy, heavy foods. If nausea and/or vomiting occur, drink only clear liquids until the  nausea and/or vomiting subsides. Call your physician if vomiting continues. ? ?Special Instructions/Symptoms: ?Your throat may feel dry or sore from the anesthesia or the breathing tube placed in your throat during surgery. If this causes discomfort, gargle with warm salt water. The discomfort should disappear within 24 hours. ? ?If you had a scopolamine patch placed behind your ear for the management of post- operative nausea and/or vomiting: ? ?1. The medication in the patch is effective for 72 hours, after which it should be removed.  Wrap patch in a tissue and discard in the trash. Wash hands thoroughly with soap and water. ?2. You may remove the patch earlier than 72 hours if you experience unpleasant side effects which may include dry mouth, dizziness or visual disturbances. ?3. Avoid touching the patch. Wash your hands with soap and water after contact with the patch. ?    ?

## 2018-09-28 NOTE — Progress Notes (Signed)
Assisted Dr. Carswell Jackson with left, ultrasound guided, adductor canal block. Side rails up, monitors on throughout procedure. See vital signs in flow sheet. Tolerated Procedure well.  

## 2018-10-01 ENCOUNTER — Encounter (HOSPITAL_BASED_OUTPATIENT_CLINIC_OR_DEPARTMENT_OTHER): Payer: Self-pay | Admitting: Orthopedic Surgery

## 2020-01-06 ENCOUNTER — Ambulatory Visit: Payer: Self-pay | Attending: Internal Medicine

## 2020-01-06 DIAGNOSIS — Z23 Encounter for immunization: Secondary | ICD-10-CM

## 2020-01-06 NOTE — Progress Notes (Signed)
   Covid-19 Vaccination Clinic  Name:  Jeffrey Johnston    MRN: [id number] DOB: 29-Jan-1996  01/06/2020  Jeffrey Johnston was observed post Covid-19 immunization for 15 minutes without incident. He was provided with Vaccine Information Sheet and instruction to access the V-Safe system.   Jeffrey Johnston was instructed to call 911 with any severe reactions post vaccine: Marland Kitchen Difficulty breathing  . Swelling of face and throat  . A fast heartbeat  . A bad rash all over body  . Dizziness and weakness   Immunizations Administered    Name Date Dose VIS Date Route   Pfizer COVID-19 Vaccine 01/06/2020  1:22 PM 0.3 mL 09/06/2019 Intramuscular   Manufacturer: [employer], [employer]   Lot: [id number]   NDC: [address]-7

## 2020-01-27 ENCOUNTER — Ambulatory Visit: Payer: Medicaid Other | Attending: Internal Medicine

## 2020-01-27 DIAGNOSIS — Z23 Encounter for immunization: Secondary | ICD-10-CM

## 2020-01-27 NOTE — Progress Notes (Signed)
   Covid-19 Vaccination Clinic  Name:  Jeffrey Johnston    MRN: [id number] DOB: August 04, 1996  01/27/2020  [name] was observed post Covid-19 immunization for 15 minutes without incident. He was provided with Vaccine Information Sheet and instruction to access the V-Safe system.   [name] was instructed to call 911 with any severe reactions post vaccine: Marland Kitchen Difficulty breathing  . Swelling of face and throat  . A fast heartbeat  . A bad rash all over body  . Dizziness and weakness   Immunizations Administered    Name Date Dose VIS Date Route   Pfizer COVID-19 Vaccine 01/27/2020  2:45 PM 0.3 mL 11/20/2018 Intramuscular   Manufacturer: [employer], [employer]   Lot: [id number]   NDC: [address]-1
